# Patient Record
Sex: Female | Born: 1960 | Hispanic: No | Marital: Married | Smoking: Never smoker
Health system: Southern US, Community
[De-identification: ages and names within clinical notes are randomized; demographics above are authoritative.]

## PROBLEM LIST (undated history)

## (undated) DIAGNOSIS — R011 Cardiac murmur, unspecified: Secondary | ICD-10-CM

## (undated) DIAGNOSIS — D473 Essential (hemorrhagic) thrombocythemia: Secondary | ICD-10-CM

## (undated) DIAGNOSIS — E785 Hyperlipidemia, unspecified: Secondary | ICD-10-CM

## (undated) DIAGNOSIS — Z87440 Personal history of urinary (tract) infections: Secondary | ICD-10-CM

## (undated) DIAGNOSIS — Z0000 Encounter for general adult medical examination without abnormal findings: Principal | ICD-10-CM

## (undated) DIAGNOSIS — J329 Chronic sinusitis, unspecified: Secondary | ICD-10-CM

## (undated) DIAGNOSIS — L989 Disorder of the skin and subcutaneous tissue, unspecified: Secondary | ICD-10-CM

## (undated) DIAGNOSIS — I341 Nonrheumatic mitral (valve) prolapse: Secondary | ICD-10-CM

## (undated) DIAGNOSIS — Z124 Encounter for screening for malignant neoplasm of cervix: Secondary | ICD-10-CM

## (undated) HISTORY — DX: Personal history of urinary (tract) infections: Z87.440

## (undated) HISTORY — DX: Encounter for general adult medical examination without abnormal findings: Z00.00

## (undated) HISTORY — DX: Disorder of the skin and subcutaneous tissue, unspecified: L98.9

## (undated) HISTORY — DX: Nonrheumatic mitral (valve) prolapse: I34.1

## (undated) HISTORY — DX: Cardiac murmur, unspecified: R01.1

## (undated) HISTORY — DX: Chronic sinusitis, unspecified: J32.9

## (undated) HISTORY — DX: Essential (hemorrhagic) thrombocythemia: D47.3

## (undated) HISTORY — DX: Encounter for screening for malignant neoplasm of cervix: Z12.4

## (undated) HISTORY — DX: Hyperlipidemia, unspecified: E78.5

---
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
## 2004-03-17 ENCOUNTER — Ambulatory Visit: Admission: RE | Admit: 2004-03-17 | Discharge: 2004-05-09 | Payer: Self-pay | Admitting: Radiation Oncology

## 2004-06-08 ENCOUNTER — Ambulatory Visit: Admission: RE | Admit: 2004-06-08 | Discharge: 2004-06-08 | Payer: Self-pay | Admitting: Radiation Oncology

## 2004-07-20 ENCOUNTER — Ambulatory Visit: Admission: RE | Admit: 2004-07-20 | Discharge: 2004-07-20 | Payer: Self-pay | Admitting: Radiation Oncology

## 2010-12-29 ENCOUNTER — Ambulatory Visit (INDEPENDENT_AMBULATORY_CARE_PROVIDER_SITE_OTHER): Payer: BC Managed Care – PPO | Admitting: Family Medicine

## 2010-12-29 ENCOUNTER — Encounter: Payer: Self-pay | Admitting: Family Medicine

## 2010-12-29 ENCOUNTER — Other Ambulatory Visit: Payer: Self-pay | Admitting: Family Medicine

## 2010-12-29 DIAGNOSIS — R011 Cardiac murmur, unspecified: Secondary | ICD-10-CM | POA: Insufficient documentation

## 2010-12-29 DIAGNOSIS — Z87440 Personal history of urinary (tract) infections: Secondary | ICD-10-CM | POA: Insufficient documentation

## 2010-12-29 DIAGNOSIS — I341 Nonrheumatic mitral (valve) prolapse: Secondary | ICD-10-CM | POA: Insufficient documentation

## 2010-12-29 DIAGNOSIS — D473 Essential (hemorrhagic) thrombocythemia: Secondary | ICD-10-CM

## 2010-12-29 DIAGNOSIS — L989 Disorder of the skin and subcutaneous tissue, unspecified: Secondary | ICD-10-CM

## 2010-12-29 DIAGNOSIS — R109 Unspecified abdominal pain: Secondary | ICD-10-CM | POA: Insufficient documentation

## 2010-12-29 DIAGNOSIS — Z1211 Encounter for screening for malignant neoplasm of colon: Secondary | ICD-10-CM

## 2010-12-29 DIAGNOSIS — E785 Hyperlipidemia, unspecified: Secondary | ICD-10-CM

## 2010-12-29 HISTORY — DX: Personal history of urinary (tract) infections: Z87.440

## 2010-12-29 HISTORY — DX: Disorder of the skin and subcutaneous tissue, unspecified: L98.9

## 2010-12-29 LAB — POCT URINALYSIS DIPSTICK
Blood, UA: NEGATIVE
Glucose, UA: NEGATIVE
Ketones, UA: 15
Spec Grav, UA: 1.02
pH, UA: 7

## 2010-12-29 MED ORDER — ALIGN PO CAPS: 1.0000 | ORAL_CAPSULE | Freq: Every day | ORAL | Status: DC

## 2010-12-29 MED ORDER — FLUCONAZOLE 150 MG PO TABS: 150.0000 mg | ORAL_TABLET | Freq: Once | ORAL | Status: AC

## 2010-12-29 NOTE — Assessment & Plan Note (Signed)
Urine looks clear today and she is asymptomatic at this time. She is encouraged to maintain adequate hydration and increase cranberry. She will notify us of any returning symptoms for repeat urinalysis. She has been worked up by urology in the past and describes a cystoscopy which was unremarkable. She was maintained on Macrodantin for many years and now off of Macrodantin she has roughly one UTI every 6 months or so. We are going to give her one dose of Diflucan to the recent Bactrim use and one episode of abdominal pain

## 2010-12-29 NOTE — Patient Instructions (Signed)

## 2010-12-30 ENCOUNTER — Encounter: Payer: Self-pay | Admitting: Family Medicine

## 2010-12-30 DIAGNOSIS — D473 Essential (hemorrhagic) thrombocythemia: Secondary | ICD-10-CM | POA: Insufficient documentation

## 2010-12-30 DIAGNOSIS — D75839 Thrombocytosis, unspecified: Secondary | ICD-10-CM

## 2010-12-30 HISTORY — DX: Thrombocytosis, unspecified: D75.839

## 2010-12-30 LAB — BASIC METABOLIC PANEL
CO2: 26 mEq/L (ref 19–32)
Calcium: 9.3 mg/dL (ref 8.4–10.5)
Creat: 0.59 mg/dL (ref 0.50–1.10)
Sodium: 140 mEq/L (ref 135–145)

## 2010-12-30 LAB — CBC
Hemoglobin: 12.5 g/dL (ref 12.0–15.0)
MCH: 30.4 pg (ref 26.0–34.0)
MCHC: 32.3 g/dL (ref 30.0–36.0)
Platelets: 545 10*3/uL — ABNORMAL HIGH (ref 150–400)

## 2010-12-30 LAB — HEPATIC FUNCTION PANEL
Alkaline Phosphatase: 59 U/L (ref 39–117)
Bilirubin, Direct: 0.1 mg/dL (ref 0.0–0.3)
Indirect Bilirubin: 0.3 mg/dL (ref 0.0–0.9)
Total Bilirubin: 0.4 mg/dL (ref 0.3–1.2)

## 2010-12-30 LAB — SEDIMENTATION RATE: Sed Rate: 16 mm/hr (ref 0–22)

## 2010-12-30 NOTE — Assessment & Plan Note (Signed)
oid trans fats and maintain a hi fiber and lean protein diet. Consider a fish oil cap daily

## 2010-12-30 NOTE — Assessment & Plan Note (Signed)
Has had 2 episodes of abdominal cramping. One lasted 5 hours, she felt she had a low grade fever and was very tired. Denies any constipation/diarrhea/nausea/diaphoresis. She is given one dose of Diflucan to address some yeast overgrowth. She will start a probiotics and she is referred to GI for colonoscopyand further evaluation

## 2010-12-30 NOTE — Assessment & Plan Note (Signed)
Has only been present for a week. Her previous physician tried to aspirate it without success. She is referred to dermatology for excision and skin exam

## 2010-12-30 NOTE — Progress Notes (Signed)
Katherine Beasley [id number] 09/12/1960 12/30/2010      Progress Note New Patient  Subjective  Chief Complaint  Chief Complaint  Patient presents with  . Establish Care    recurrent bladder infections, recent bactrim RX; also had cyst on chest, MD unable to aspirate  . Abdominal Pain    associated with back pain, questions if this could be kidney stone, pain resolved at this time    HPI  Patient is a 50 year old Caucasian female who is in today to establish care her major medical history in the past has been recurrent UTIs. She has undergone cystoscopy. She reports this was unremarkable. She was maintained on Macrodantin but is no longer maintained on this. Has a UTI roughly every 6 months. Which is treated with Bactrim. No urinary symptoms at this time. She is noting she had couple of episodes of crampy abdominal pain last week that are concerning to her. No one around her has had similar symptoms. She normally has a formed brown bowel movement daily and that is still remained true. She did have one day with this 2 bowel movements instead of one. She describes diffuse abdominal cramping which lasted about 5 hours and then resolve. Then had another episode a few days later but localized to the left lower quadrant and has now resolved. She also had an episode of low back pain but that has resolved as well. She's never seen any bloody or tarry stool. She's not had any nausea, vomiting, diaphoresis. No anorexia. She feels well today. She does have some skin lesions as well. He had a pustular lesion on her sternum which resolved but then last week she developed a new lesion that was not pustular is firm persistent. Her previous PMD tried to aspirate it but it did not succeed. She is yet to go for colonoscopy. No chest pain, palpitations, SOB, fevers  Past Medical History  Diagnosis Date  . MVP (mitral valve prolapse) 30 years ago  . Abdominal pain 12/29/2010  . Heart murmur   . Hyperlipidemia    no meds, diet only  . History of recurrent UTIs 12/29/2010  . Skin lesion of chest wall 12/29/2010  . Thrombocytosis 12/30/2010    Past Surgical History  Procedure Date  . Cesarean section     x 2  . Tonsillectomy 1966    Family History  Problem Relation Age of Onset  . Hyperlipidemia Mother   . Thyroid disease Mother     partial thyroidectomy many years ago for goiter now hypothyroid  . Cancer Father 57    prostate  . Hyperlipidemia Father   . Hypertension Father   . Diabetes Paternal Aunt   . Diabetes Paternal Uncle   . Cancer Maternal Grandmother 63    breast  . Stroke Paternal Grandfather   . Diabetes Paternal Grandfather     History   Social History  . Marital Status: Married    Spouse Name: Genevie Cheshire    Number of Children: 2  . Years of Education: N/A   Occupational History  . Not on file.   Social History Main Topics  . Smoking status: Never Smoker   . Smokeless tobacco: Never Used  . Alcohol Use: No  . Drug Use: No  . Sexually Active: Yes   Other Topics Concern  . Not on file   Social History Narrative  . No narrative on file    No current outpatient prescriptions on file prior to visit.    No Known  Allergies  Review of Systems  Review of Systems  Constitutional: Negative for fever, chills and malaise/fatigue.  HENT: Negative for hearing loss, nosebleeds and congestion.   Eyes: Negative for discharge.  Respiratory: Negative for cough, sputum production, shortness of breath and wheezing.   Cardiovascular: Negative for chest pain, palpitations and leg swelling.  Gastrointestinal: Negative for heartburn, nausea, vomiting, abdominal pain, diarrhea, constipation and blood in stool.  Genitourinary: Negative for dysuria, urgency, frequency and hematuria.  Musculoskeletal: Negative for myalgias, back pain and falls.  Skin: Positive for rash.       2 cm lesion, raised, fixed, nontender no warmth or erythema on sternum, semifirm  Neurological:  Negative for dizziness, tremors, sensory change, focal weakness, loss of consciousness, weakness and headaches.  Endo/Heme/Allergies: Negative for polydipsia. Does not bruise/bleed easily.  Psychiatric/Behavioral: Negative for depression and suicidal ideas. The patient is not nervous/anxious and does not have insomnia.     Objective  BP 137/78  Pulse 68  Temp(Src) 97.9 F (36.6 C) (Oral)  Ht 5' 5.5" (1.664 m)  Wt 153 lb (69.4 kg)  BMI 25.07 kg/m2  SpO2 100%  Physical Exam  Physical Exam  Constitutional: She is oriented to person, place, and time and well-developed, well-nourished, and in no distress. No distress.  HENT:  Head: Normocephalic and atraumatic.  Right Ear: External ear normal.  Left Ear: External ear normal.  Nose: Nose normal.  Mouth/Throat: Oropharynx is clear and moist. No oropharyngeal exudate.  Eyes: Conjunctivae are normal. Pupils are equal, round, and reactive to light. Right eye exhibits no discharge. Left eye exhibits no discharge. No scleral icterus.  Neck: Normal range of motion. Neck supple. No thyromegaly present.  Cardiovascular: Normal rate, regular rhythm and intact distal pulses.   Murmur heard.      1/6 sys M  Pulmonary/Chest: Effort normal and breath sounds normal. No respiratory distress. She has no wheezes. She has no rales.  Abdominal: Soft. Bowel sounds are normal. She exhibits no distension and no mass. There is no tenderness.  Musculoskeletal: Normal range of motion. She exhibits no edema and no tenderness.  Lymphadenopathy:    She has no cervical adenopathy.  Neurological: She is alert and oriented to person, place, and time. She has normal reflexes. No cranial nerve deficit. Gait normal. Coordination normal.  Skin: Skin is warm and dry. She is not diaphoretic.       2 cm, raised lesion over sternum, no erythema, no warmth, no pustular component  Psychiatric: Mood, memory and affect normal.       Assessment & Plan  History of  recurrent UTIs Urine looks clear today and she is asymptomatic at this time. She is encouraged to maintain adequate hydration and increase cranberry. She will notify us of any returning symptoms for repeat urinalysis. She has been worked up by urology in the past and describes a cystoscopy which was unremarkable. She was maintained on Macrodantin for many years and now off of Macrodantin she has roughly one UTI every 6 months or so. We are going to give her one dose of Diflucan to the recent Bactrim use and one episode of abdominal pain  Abdominal pain Has had 2 episodes of abdominal cramping. One lasted 5 hours, she felt she had a low grade fever and was very tired. Denies any constipation/diarrhea/nausea/diaphoresis. She is given one dose of Diflucan to address some yeast overgrowth. She will start a probiotics and she is referred to GI for colonoscopyand further evaluation  Thrombocytosis No known history,  will repeat cbc this week after increased hydration.  Skin lesion of chest wall Has only been present for a week. Her previous physician tried to aspirate it without success. She is referred to dermatology for excision and skin exam  Hyperlipidemia oid trans fats and maintain a hi fiber and lean protein diet. Consider a fish oil cap daily

## 2010-12-30 NOTE — Assessment & Plan Note (Signed)
No known history, will repeat cbc this week after increased hydration.

## 2011-01-01 ENCOUNTER — Encounter: Payer: Self-pay | Admitting: Gastroenterology

## 2011-01-03 ENCOUNTER — Other Ambulatory Visit (INDEPENDENT_AMBULATORY_CARE_PROVIDER_SITE_OTHER): Payer: PRIVATE HEALTH INSURANCE

## 2011-01-03 DIAGNOSIS — R109 Unspecified abdominal pain: Secondary | ICD-10-CM

## 2011-01-03 LAB — CBC
HCT: 39 % (ref 36.0–46.0)
Hemoglobin: 13.1 g/dL (ref 12.0–15.0)
MCH: 30.8 pg (ref 26.0–34.0)
MCV: 91.5 fL (ref 78.0–100.0)
Platelets: 398 10*3/uL (ref 150–400)
RBC: 4.26 MIL/uL (ref 3.87–5.11)
WBC: 6.2 10*3/uL (ref 4.0–10.5)

## 2011-01-17 ENCOUNTER — Encounter: Payer: Self-pay | Admitting: Family Medicine

## 2011-01-19 ENCOUNTER — Encounter: Payer: Self-pay | Admitting: Gastroenterology

## 2011-01-19 ENCOUNTER — Other Ambulatory Visit: Payer: PRIVATE HEALTH INSURANCE

## 2011-01-19 ENCOUNTER — Ambulatory Visit (INDEPENDENT_AMBULATORY_CARE_PROVIDER_SITE_OTHER): Payer: BC Managed Care – PPO | Admitting: Gastroenterology

## 2011-01-19 VITALS — BP 132/76 | HR 60 | Ht 67.0 in | Wt 153.2 lb

## 2011-01-19 DIAGNOSIS — R109 Unspecified abdominal pain: Secondary | ICD-10-CM

## 2011-01-19 DIAGNOSIS — R1010 Upper abdominal pain, unspecified: Secondary | ICD-10-CM | POA: Insufficient documentation

## 2011-01-19 DIAGNOSIS — Z1211 Encounter for screening for malignant neoplasm of colon: Secondary | ICD-10-CM

## 2011-01-19 MED ORDER — PEG-KCL-NACL-NASULF-NA ASC-C 100 G PO SOLR: 1.0000 | Freq: Once | ORAL | Status: DC

## 2011-01-19 NOTE — Progress Notes (Signed)
History of Present Illness:  This is a extremely pleasant 50 year old Caucasian female referred through the courtesy of Drs. Reuel Derby for evaluation of intermittent upper abdominal discomfort over the last several weeks with low-grade fever. Labs show normal CBC, metabolic functions, and liver enzymes. Patient denies nausea vomiting, reflux symptoms, dysphagia, melena, rub or systemic or hepatobiliary complaints. She gives no known history of peptic ulcer disease, hepatitis or pancreatitis. There is no history of alcohol, cigarette, or NSAID abuse. Currently he is on probiotic therapy. Family history is remarkable for colon polyps in her mother at an elderly age. The patient does have mild lactose intolerance. She has not had previous endoscopic exams or radiographic imaging studies.  I have reviewed this patient's present history, medical and surgical past history, allergies and medications.     ROS: The remainder of the 10 point ROS is negative... patient has had chronic recurrent urinary tract infections but has had a negative recent urinalysis. She also questions possible kidney stone passage with recent low back pain. Now she has had a previous negative cystoscopy. Currently she denies any genitourinary or gynecologic symptoms. She has undergone menopause.    Physical Exam: General well developed well nourished patient in no acute distress, appearing her stated age Eyes PERRLA, no icterus, fundoscopic exam per opthamologist Skin no lesions noted Neck supple, no adenopathy, no thyroid enlargement, no tenderness Chest clear to percussion and auscultation Heart no significant murmurs, gallops or rubs noted Abdomen no hepatosplenomegaly masses or tenderness, BS normal. . Extremities no acute joint lesions, edema, phlebitis or evidence of cellulitis. Neurologic patient oriented x 3, cranial nerves intact, no focal neurologic deficits noted. Psychological mental status normal and normal  affect.  Assessment and plan: I have ordered upper abdominal ultrasound exam to exclude cholelithiasis, also any evidence of hydronephrosis and kidney stones. We will check celiac panel per her symptoms and [place] lineage. Also we have scheduled a screening colonoscopy because of her family history and her age. Patient currently denies any dyspepsia, reflux symptoms, or upper abdominal issues. He may need endoscopic exam depending on her lab work and clinical course. Otherwise she is to continue medications as listed and reviewed per primary care.  Encounter Diagnoses  Name Primary?  Marland Kitchen Upper abdominal pain Yes  . Screening for colon cancer

## 2011-01-19 NOTE — Patient Instructions (Signed)
You have been scheduled for an Abdominal Ultrasound at [facility] on Monday 01/23/11 at 11:00 am. Please arrive at the Radiology Dept on the 1st floor at 10:45 am. Please have nothing to eat or drink after 5:00 am which is 6 hours prior to the test. You have been scheduled for a Colonoscopy with separate instructions given. Your prep kit has been sent to your pharmacy for you to pick up. Please go to the basement upon leaving today to have your labs done.

## 2011-01-22 ENCOUNTER — Ambulatory Visit (HOSPITAL_COMMUNITY)
Admission: RE | Admit: 2011-01-22 | Discharge: 2011-01-22 | Disposition: A | Discharge status: Home / Self Care | Payer: BC Managed Care – PPO | Source: Ambulatory Visit | Attending: Gastroenterology | Admitting: Gastroenterology

## 2011-01-22 DIAGNOSIS — R109 Unspecified abdominal pain: Secondary | ICD-10-CM | POA: Insufficient documentation

## 2011-01-22 DIAGNOSIS — R1010 Upper abdominal pain, unspecified: Secondary | ICD-10-CM

## 2011-01-22 DIAGNOSIS — K824 Cholesterolosis of gallbladder: Secondary | ICD-10-CM | POA: Insufficient documentation

## 2011-01-22 LAB — GLIA (IGA/G) + TTG IGA
Gliadin IgA: 48.4 U/mL — ABNORMAL HIGH (ref <20)
Gliadin IgG: 6.7 U/mL (ref <20)
Tissue Transglutaminase Ab, IgA: 7.6 U/mL (ref <20)

## 2011-01-23 ENCOUNTER — Telehealth: Payer: Self-pay | Admitting: *Deleted

## 2011-01-23 NOTE — Telephone Encounter (Signed)
lmom for pt to call back if she has any questions. Her labs showed a possible insensitivity to Gluten and he would like for her to try a Gluten Free for 6 weeks; I am mailing a copy of the diet.

## 2011-01-23 NOTE — Telephone Encounter (Signed)
Spoke with pt to inform her Dr Jarold Motto would like for her to try a Gluten Free diet for 6 weeks to see if she feels better. Her lab indicated she may have  Sensitivity to gluten; mailed her a copy of the diet. Pt will call for problems.

## 2011-02-05 ENCOUNTER — Ambulatory Visit (AMBULATORY_SURGERY_CENTER): Payer: BC Managed Care – PPO | Admitting: Gastroenterology

## 2011-02-05 ENCOUNTER — Encounter: Payer: Self-pay | Admitting: Gastroenterology

## 2011-02-05 DIAGNOSIS — K644 Residual hemorrhoidal skin tags: Secondary | ICD-10-CM

## 2011-02-05 DIAGNOSIS — Z8371 Family history of colonic polyps: Secondary | ICD-10-CM

## 2011-02-05 DIAGNOSIS — K9041 Non-celiac gluten sensitivity: Secondary | ICD-10-CM

## 2011-02-05 DIAGNOSIS — Z1211 Encounter for screening for malignant neoplasm of colon: Secondary | ICD-10-CM | POA: Insufficient documentation

## 2011-02-05 DIAGNOSIS — R109 Unspecified abdominal pain: Secondary | ICD-10-CM | POA: Insufficient documentation

## 2011-02-05 DIAGNOSIS — Z83719 Family history of colon polyps, unspecified: Secondary | ICD-10-CM

## 2011-02-05 MED ORDER — SODIUM CHLORIDE 0.9 % IV SOLN: 500.0000 mL | INTRAVENOUS | Status: DC

## 2011-02-05 NOTE — Patient Instructions (Signed)
INTERNAL HEMORRHOIDS  SEE GREEN AND BLUE SHEETS FOR ADDITIONAL D/C INSTRUCTIONS.

## 2011-02-05 NOTE — Progress Notes (Signed)
Patient did not experience any of the following events: a burn prior to discharge; a fall within the facility; wrong site/side/patient/procedure/implant event; or a hospital transfer or hospital admission upon discharge from the facility. (G8907) Patient did not have preoperative order for IV antibiotic SSI prophylaxis. (G8918)  

## 2011-02-06 ENCOUNTER — Telehealth: Payer: Self-pay | Admitting: *Deleted

## 2011-02-06 NOTE — Telephone Encounter (Signed)

## 2011-04-04 ENCOUNTER — Other Ambulatory Visit (HOSPITAL_COMMUNITY)
Admission: RE | Admit: 2011-04-04 | Discharge: 2011-04-04 | Disposition: A | Discharge status: Home / Self Care | Payer: BC Managed Care – PPO | Source: Ambulatory Visit | Attending: Family Medicine | Admitting: Family Medicine

## 2011-04-04 ENCOUNTER — Encounter: Payer: Self-pay | Admitting: Family Medicine

## 2011-04-04 ENCOUNTER — Ambulatory Visit (INDEPENDENT_AMBULATORY_CARE_PROVIDER_SITE_OTHER): Payer: BC Managed Care – PPO | Admitting: Family Medicine

## 2011-04-04 VITALS — BP 122/80 | HR 81 | Temp 98.5°F | Ht 67.0 in | Wt 152.8 lb

## 2011-04-04 DIAGNOSIS — Z87440 Personal history of urinary (tract) infections: Secondary | ICD-10-CM

## 2011-04-04 DIAGNOSIS — Z124 Encounter for screening for malignant neoplasm of cervix: Secondary | ICD-10-CM

## 2011-04-04 DIAGNOSIS — Z01419 Encounter for gynecological examination (general) (routine) without abnormal findings: Secondary | ICD-10-CM | POA: Insufficient documentation

## 2011-04-04 DIAGNOSIS — K9041 Non-celiac gluten sensitivity: Secondary | ICD-10-CM

## 2011-04-04 DIAGNOSIS — E785 Hyperlipidemia, unspecified: Secondary | ICD-10-CM

## 2011-04-04 DIAGNOSIS — Z23 Encounter for immunization: Secondary | ICD-10-CM

## 2011-04-04 DIAGNOSIS — K900 Celiac disease: Secondary | ICD-10-CM

## 2011-04-04 DIAGNOSIS — Z1211 Encounter for screening for malignant neoplasm of colon: Secondary | ICD-10-CM

## 2011-04-04 HISTORY — DX: Encounter for screening for malignant neoplasm of cervix: Z12.4

## 2011-04-04 NOTE — Assessment & Plan Note (Signed)
Pap taken today, results pending. No concerns identified on exam. Repeat pap in 2-3 years time at patient's discretion if results today are normal. Patient without c/o.

## 2011-04-04 NOTE — Assessment & Plan Note (Addendum)
Pap smear taken today no concerns  Tdap given today

## 2011-04-04 NOTE — Assessment & Plan Note (Signed)
Patient has redoubled her efforts to avoid gluten sincer her testing confirmed the senistivity and she does feel better.

## 2011-04-04 NOTE — Assessment & Plan Note (Signed)
Patient tolerated well.

## 2011-04-04 NOTE — Assessment & Plan Note (Signed)
Patient reports this is historically mild and she has it checked free at work annually, agrees to bring a copy of this years labs with hre to her next visit.

## 2011-04-04 NOTE — Patient Instructions (Signed)
Celiac Disease/Gluten Insensitivity Celiac disease is a digestive disease that causes your body's natural defense system (immune system) to react against its own cells. It interferes with taking in (absorbing) nutrients from food. Celiac disease is also known as celiac sprue, nontropical sprue, and gluten-sensitive enteropathy. People who have celiac disease cannot tolerate gluten. Gluten is a protein found in wheat, rye, and barley. With time, celiac disease will damage the cells lining the small intestine. This leads to being unable to absorb nutrients from food (malabsorption), diarrhea, and nutritional problems. CAUSES  Celiac disease is genetic. This means you have a higher likelihood of getting the disease if someone in your family has or has had it. Up to 10% of your close relatives (parent, sibling, child) may also have the disease.  People with celiac disease tend to have other autoimmune diseases. The link may be genetic. These diseases include:  Dermatitis herpetiformis.   Thyroid disease.   Systemic lupus erythematosis.   Type 1 diabetes.   Liver disease.   Collagen vascular disease.   Rheumatoid arthritis.   Sjgren's syndrome.  SYMPTOMS  The symptoms of celiac disease vary from person to person. The symptoms are generally digestive or nutritional. Digestive symptoms include:  Recurring belly (abdominal) bloating and pain.   Gas.   Long-term (chronic) diarrhea.   Pale, bad-smelling, greasy, or oily stool.  Nutritional symptoms include:  Failure to thrive in infants.   Delayed growth in children.   Weight loss in children and adults.   Missed menstrual periods (often due to extreme weight loss).   Anemia.   Weakening bones (osteoporosis).   Fatigue and weakness.   Tingling or other signs of nerve damage (peripheral neuropathy).   Depression.  DIAGNOSIS  If your symptoms and physical exam suggest that a digestive disorder or malnutrition is present, your  caregiver may suspect celiac disease. You may have already begun a gluten-free diet. If symptoms persist, testing may be needed to confirm the diagnosis. Some tests are best done while you are on a normal, unrestricted diet. Tests may include:  Blood tests to check for nutritional deficiencies.   Blood tests to look for evidence that the body is producing antibodies against its own small intestine cells.   Taking a tissue sample (biopsy) from the small bowel for evaluation.   X-rays of the small bowel.   Evaluating the stool for fat.   Tests to check for nutrient absorption from the intestine.  TREATMENT  It is important to seek treatment. Untreated celiac disease can cause growth problems (in children), anemia, osteoporosis, and possible nerve problems. A pregnant patient with untreated celiac disease has a higher risk of miscarriage, and the fetus has an increased risk of low birth weight and other growth problems. If celiac disease is diagnosed in the early stages, treatment can allow you to live a long, nearly symptom-free life. Treatment includes following a gluten-free diet. This means avoiding all foods that contain gluten. Eating even a small amount of gluten can damage your intestine. For most people, following this diet will stop symptoms. It will heal existing intestinal damage and prevent further damage. Improvements begin within days of starting the diet. The small intestine is usually completely healed within 3 to 6 months, or it may take up to 2 years for older adults. A small percentage of people do not improve on the gluten-free diet. Depending on your age and the stage at which you were diagnosed, some problems such as delayed growth and discolored teeth  may not improve. Sometimes, damaged intestines cannot heal. If your intestines are not absorbing enough nutrients, you may need to receive nutrition supplements through an intravenous (IV) tube. Drug treatments are being tested for  unresponsive celiac disease. In this case, you may need to be evaluated for complications of the disease. Your caregiver may also recommend:  A pneumonia vaccination.   Nutrients and other treatments for any nutritional deficiencies.  Your caregiver can provide you with more information on a gluten-free diet. Discussion with a dietician skilled in this illness will be valuable. Support groups may also be helpful. HOME CARE INSTRUCTIONS   Focus on a gluten-free diet. This diet must become a way of life.   Monitor your response to the gluten-free diet and treat any nutritional deficiencies.   Prepare ahead of time if you decide to eat outside the home.   Make and keep your regular follow-up visits with your caregiver.   Suggest to family members that they get screened for early signs of the disease.  SEEK MEDICAL CARE IF:   You continue to have digestive symptoms (gas, cramping, diarrhea) despite a proper diet.   You have trouble sticking to the gluten-free diet.   You develop an itchy rash with groups of tiny blisters.   You develop severe weakness, balance problems, menstrual problems, or depression.  Document Released: 02/19/2005 Document Revised: 11/01/2010 Document Reviewed: 06/08/2009 [facility] Patient Information 2012 [place], [place].

## 2011-04-04 NOTE — Assessment & Plan Note (Signed)
No recent episodes. Maintain adequate hydration.

## 2011-04-04 NOTE — Progress Notes (Signed)
Patient ID: Katherine Beasley, female   DOB: 09-06-1960, 51 y.o.   MRN: [id number] Katherine Beasley [id number] [birth year] 04/04/2011      Progress Note-Follow Up  Subjective  Chief Complaint  Chief Complaint  Patient presents with  . Follow-up    3 month follow up  . Gynecologic Exam    pap    HPI  51 year old Caucasian female who is in today for followup and Pap smear. She reports she is doing much better at this time. She felt well since her last visit. She proceeded with colonoscopy and lab work with GI and did have some gluten insensitivity confirmed via lab work. She's been avoiding gluten and actually feels much better. Denies abdominal pain or any bowel difficulties. She's had no recent illness, fevers, chills, chest pain, palpitations, shortness of breath, GI or GU complaints. She offers no GYN or breast complaints. She's had no recent urinary tract infection and overall she says she feels very well at this time.  Past Medical History  Diagnosis Date  . MVP (mitral valve prolapse) 30 years ago  . Abdominal pain 12/29/2010  . Heart murmur   . Hyperlipidemia     no meds, diet only  . History of recurrent UTIs 12/29/2010  . Skin lesion of chest wall 12/29/2010  . Thrombocytosis 12/30/2010  . Cervical cancer screening 04/04/2011    Past Surgical History  Procedure Date  . Cesarean section     x 2  . Tonsillectomy 1966    Family History  Problem Relation Age of Onset  . Hyperlipidemia Mother   . Thyroid disease Mother     partial thyroidectomy many years ago for goiter now hypothyroid  . Colon polyps Mother   . Cancer Father 79    prostate  . Hyperlipidemia Father   . Hypertension Father   . Prostate cancer Father   . Diabetes Paternal Aunt   . Diabetes Paternal Uncle   . Cancer Maternal Grandmother 98    breast  . Breast cancer Maternal Grandmother   . Stroke Paternal Grandfather   . Diabetes Paternal Grandfather     History   Social History  . Marital  Status: Married    Spouse Name: Genevie Cheshire    Number of Children: 2  . Years of Education: N/A   Occupational History  . Not on file.   Social History Main Topics  . Smoking status: Never Smoker   . Smokeless tobacco: Never Used  . Alcohol Use: No  . Drug Use: No  . Sexually Active: Yes   Other Topics Concern  . Not on file   Social History Narrative  . No narrative on file    Current Outpatient Prescriptions on File Prior to Visit  Medication Sig Dispense Refill  . calcium carbonate (OS-CAL) 600 MG TABS Take 2 tablets by mouth daily.        . Omega-3 Fatty Acids (FISH OIL) 1200 MG CAPS Take 1 capsule by mouth 3 (three) times daily after meals.          No Known Allergies  Review of Systems  Review of Systems  Constitutional: Negative for fever and malaise/fatigue.  HENT: Negative for congestion.   Eyes: Negative for discharge.  Respiratory: Negative for shortness of breath.   Cardiovascular: Negative for chest pain, palpitations and leg swelling.  Gastrointestinal: Negative for nausea, abdominal pain and diarrhea.  Genitourinary: Negative for dysuria.  Musculoskeletal: Negative for falls.  Skin: Negative for rash.  Neurological: Negative for  loss of consciousness and headaches.  Endo/Heme/Allergies: Negative for polydipsia.  Psychiatric/Behavioral: Negative for depression and suicidal ideas. The patient is not nervous/anxious and does not have insomnia.     Objective  BP 122/80  Pulse 81  Temp(Src) 98.5 F (36.9 C) (Temporal)  Ht 5\' 7"  (1.702 m)  Wt 152 lb 12.8 oz (69.31 kg)  BMI 23.93 kg/m2  SpO2 100%  Physical Exam  Physical Exam  Constitutional: She is oriented to person, place, and time and well-developed, well-nourished, and in no distress. No distress.  HENT:  Head: Normocephalic and atraumatic.  Eyes: Conjunctivae are normal.  Neck: Neck supple. No thyromegaly present.  Cardiovascular: Normal rate, regular rhythm and normal heart sounds.   No  murmur heard. Pulmonary/Chest: Effort normal and breath sounds normal. She has no wheezes.  Abdominal: She exhibits no distension and no mass.  Genitourinary: Vagina normal, uterus normal, cervix normal, right adnexa normal and left adnexa normal. No vaginal discharge found.  Musculoskeletal: She exhibits no edema.  Lymphadenopathy:    She has no cervical adenopathy.  Neurological: She is alert and oriented to person, place, and time.  Skin: Skin is warm and dry. No rash noted. She is not diaphoretic.  Psychiatric: Memory, affect and judgment normal.    No results found for this basename: TSH   Lab Results  Component Value Date   WBC 6.2 01/03/2011   HGB 13.1 01/03/2011   HCT 39.0 01/03/2011   MCV 91.5 01/03/2011   PLT 398 01/03/2011   Lab Results  Component Value Date   CREATININE 0.59 12/29/2010   BUN 12 12/29/2010   NA 140 12/29/2010   K 4.0 12/29/2010   CL 103 12/29/2010   CO2 26 12/29/2010   Lab Results  Component Value Date   ALT 14 12/29/2010   AST 14 12/29/2010   ALKPHOS 59 12/29/2010   BILITOT 0.4 12/29/2010   No results found for this basename: CHOL   No results found for this basename: HDL   No results found for this basename: LDLCALC   No results found for this basename: TRIG   No results found for this basename: CHOLHDL     Assessment & Plan  Cervical cancer screening Pap smear taken today no concerns  Tdap given today  Screening for colon cancer Pap taken today, results pending. No concerns identified on exam. Repeat pap in 2-3 years time at patient's discretion if results today are normal. Patient without c/o.  Special screening for malignant neoplasms, colon Patient tolerated well   Non-celiac gluten sensitivity Patient has redoubled her efforts to avoid gluten sincer her testing confirmed the senistivity and she does feel better.   History of recurrent UTIs No recent episodes. Maintain adequate hydration.  Hyperlipidemia Patient  reports this is historically mild and she has it checked free at work annually, agrees to bring a copy of this years labs with hre to her next visit.

## 2011-12-04 LAB — HM MAMMOGRAPHY

## 2012-03-13 ENCOUNTER — Telehealth: Payer: Self-pay | Admitting: Family Medicine

## 2012-03-13 MED ORDER — SCOPOLAMINE 1 MG/3DAYS TD PT72: 1.0000 | MEDICATED_PATCH | TRANSDERMAL | Status: DC

## 2012-03-13 NOTE — Telephone Encounter (Signed)
Please advise 

## 2012-03-13 NOTE — Telephone Encounter (Signed)
Pt informed. RX sent to pharmacy. 

## 2012-03-13 NOTE — Telephone Encounter (Signed)
Scopalamine patch, 1.5mg /72 hours, place behind ear change every 72 hours, disp #4 patches

## 2012-04-04 ENCOUNTER — Encounter: Payer: BC Managed Care – PPO | Admitting: Family Medicine

## 2012-04-11 ENCOUNTER — Ambulatory Visit (INDEPENDENT_AMBULATORY_CARE_PROVIDER_SITE_OTHER): Payer: 59 | Admitting: Family Medicine

## 2012-04-11 ENCOUNTER — Encounter: Payer: Self-pay | Admitting: Family Medicine

## 2012-04-11 VITALS — BP 144/82 | HR 81 | Temp 98.4°F | Ht 67.0 in | Wt 156.0 lb

## 2012-04-11 DIAGNOSIS — J069 Acute upper respiratory infection, unspecified: Secondary | ICD-10-CM

## 2012-04-11 DIAGNOSIS — J329 Chronic sinusitis, unspecified: Secondary | ICD-10-CM

## 2012-04-11 DIAGNOSIS — R109 Unspecified abdominal pain: Secondary | ICD-10-CM

## 2012-04-11 DIAGNOSIS — E785 Hyperlipidemia, unspecified: Secondary | ICD-10-CM

## 2012-04-11 DIAGNOSIS — Z0000 Encounter for general adult medical examination without abnormal findings: Secondary | ICD-10-CM

## 2012-04-11 LAB — LIPID PANEL
Cholesterol: 231 mg/dL — ABNORMAL HIGH (ref 0–200)
Total CHOL/HDL Ratio: 6
Triglycerides: 195 mg/dL — ABNORMAL HIGH (ref 0.0–149.0)
VLDL: 39 mg/dL (ref 0.0–40.0)

## 2012-04-11 LAB — RENAL FUNCTION PANEL
Albumin: 4.3 g/dL (ref 3.5–5.2)
BUN: 14 mg/dL (ref 6–23)
Calcium: 9.1 mg/dL (ref 8.4–10.5)
Glucose, Bld: 105 mg/dL — ABNORMAL HIGH (ref 70–99)
Potassium: 4.1 mEq/L (ref 3.5–5.1)

## 2012-04-11 LAB — HEPATIC FUNCTION PANEL
ALT: 20 U/L (ref 0–35)
Alkaline Phosphatase: 62 U/L (ref 39–117)
Bilirubin, Direct: 0 mg/dL (ref 0.0–0.3)
Total Bilirubin: 1 mg/dL (ref 0.3–1.2)

## 2012-04-11 LAB — CBC
MCV: 91.4 fl (ref 78.0–100.0)
RBC: 4.49 Mil/uL (ref 3.87–5.11)
WBC: 8.6 10*3/uL (ref 4.5–10.5)

## 2012-04-11 MED ORDER — AZITHROMYCIN 250 MG PO TABS: ORAL_TABLET | ORAL | Status: DC

## 2012-04-11 NOTE — Patient Instructions (Addendum)
Preventive Care for Adults, Female A healthy lifestyle and preventive care can promote health and wellness. Preventive health guidelines for women include the following key practices.  A routine yearly physical is a good way to check with your caregiver about your health and preventive screening. It is a chance to share any concerns and updates on your health, and to receive a thorough exam.  Visit your dentist for a routine exam and preventive care every 6 months. Brush your teeth twice a day and floss once a day. Good oral hygiene prevents tooth decay and gum disease.  The frequency of eye exams is based on your age, health, family medical history, use of contact lenses, and other factors. Follow your caregiver's recommendations for frequency of eye exams.  Eat a healthy diet. Foods like vegetables, fruits, whole grains, low-fat dairy products, and lean protein foods contain the nutrients you need without too many calories. Decrease your intake of foods high in solid fats, added sugars, and salt. Eat the right amount of calories for you.Get information about a proper diet from your caregiver, if necessary.  Regular physical exercise is one of the most important things you can do for your health. Most adults should get at least 150 minutes of moderate-intensity exercise (any activity that increases your heart rate and causes you to sweat) each week. In addition, most adults need muscle-strengthening exercises on 2 or more days a week.  Maintain a healthy weight. The body mass index (BMI) is a screening tool to identify possible weight problems. It provides an estimate of body fat based on height and weight. Your caregiver can help determine your BMI, and can help you achieve or maintain a healthy weight.For adults 20 years and older:  A BMI below 18.5 is considered underweight.  A BMI of 18.5 to 24.9 is normal.  A BMI of 25 to 29.9 is considered overweight.  A BMI of 30 and above is  considered obese.  Maintain normal blood lipids and cholesterol levels by exercising and minimizing your intake of saturated fat. Eat a balanced diet with plenty of fruit and vegetables. Blood tests for lipids and cholesterol should begin at age 20 and be repeated every 5 years. If your lipid or cholesterol levels are high, you are over 50, or you are at high risk for heart disease, you may need your cholesterol levels checked more frequently.Ongoing high lipid and cholesterol levels should be treated with medicines if diet and exercise are not effective.  If you smoke, find out from your caregiver how to quit. If you do not use tobacco, do not start.  If you are pregnant, do not drink alcohol. If you are breastfeeding, be very cautious about drinking alcohol. If you are not pregnant and choose to drink alcohol, do not exceed 1 drink per day. One drink is considered to be 12 ounces (355 mL) of beer, 5 ounces (148 mL) of wine, or 1.5 ounces (44 mL) of liquor.  Avoid use of street drugs. Do not share needles with anyone. Ask for help if you need support or instructions about stopping the use of drugs.  High blood pressure causes heart disease and increases the risk of stroke. Your blood pressure should be checked at least every 1 to 2 years. Ongoing high blood pressure should be treated with medicines if weight loss and exercise are not effective.  If you are 55 to 54 years old, ask your caregiver if you should take aspirin to prevent strokes.  Diabetes   screening involves taking a blood sample to check your fasting blood sugar level. This should be done once every 3 years, after age 45, if you are within normal weight and without risk factors for diabetes. Testing should be considered at a younger age or be carried out more frequently if you are overweight and have at least 1 risk factor for diabetes.  Breast cancer screening is essential preventive care for women. You should practice "breast  self-awareness." This means understanding the normal appearance and feel of your breasts and may include breast self-examination. Any changes detected, no matter how small, should be reported to a caregiver. Women in their 20s and 30s should have a clinical breast exam (CBE) by a caregiver as part of a regular health exam every 1 to 3 years. After age 40, women should have a CBE every year. Starting at age 40, women should consider having a mammography (breast X-ray test) every year. Women who have a family history of breast cancer should talk to their caregiver about genetic screening. Women at a high risk of breast cancer should talk to their caregivers about having magnetic resonance imaging (MRI) and a mammography every year.  The Pap test is a screening test for cervical cancer. A Pap test can show cell changes on the cervix that might become cervical cancer if left untreated. A Pap test is a procedure in which cells are obtained and examined from the lower end of the uterus (cervix).  Women should have a Pap test starting at age 21.  Between ages 21 and 29, Pap tests should be repeated every 2 years.  Beginning at age 30, you should have a Pap test every 3 years as long as the past 3 Pap tests have been normal.  Some women have medical problems that increase the chance of getting cervical cancer. Talk to your caregiver about these problems. It is especially important to talk to your caregiver if a new problem develops soon after your last Pap test. In these cases, your caregiver may recommend more frequent screening and Pap tests.  The above recommendations are the same for women who have or have not gotten the vaccine for human papillomavirus (HPV).  If you had a hysterectomy for a problem that was not cancer or a condition that could lead to cancer, then you no longer need Pap tests. Even if you no longer need a Pap test, a regular exam is a good idea to make sure no other problems are  starting.  If you are between ages 65 and 70, and you have had normal Pap tests going back 10 years, you no longer need Pap tests. Even if you no longer need a Pap test, a regular exam is a good idea to make sure no other problems are starting.  If you have had past treatment for cervical cancer or a condition that could lead to cancer, you need Pap tests and screening for cancer for at least 20 years after your treatment.  If Pap tests have been discontinued, risk factors (such as a new sexual partner) need to be reassessed to determine if screening should be resumed.  The HPV test is an additional test that may be used for cervical cancer screening. The HPV test looks for the virus that can cause the cell changes on the cervix. The cells collected during the Pap test can be tested for HPV. The HPV test could be used to screen women aged 30 years and older, and should   be used in women of any age who have unclear Pap test results. After the age of 30, women should have HPV testing at the same frequency as a Pap test.  Colorectal cancer can be detected and often prevented. Most routine colorectal cancer screening begins at the age of 50 and continues through age 75. However, your caregiver may recommend screening at an earlier age if you have risk factors for colon cancer. On a yearly basis, your caregiver may provide home test kits to check for hidden blood in the stool. Use of a small camera at the end of a tube, to directly examine the colon (sigmoidoscopy or colonoscopy), can detect the earliest forms of colorectal cancer. Talk to your caregiver about this at age 50, when routine screening begins. Direct examination of the colon should be repeated every 5 to 10 years through age 75, unless early forms of pre-cancerous polyps or small growths are found.  Hepatitis C blood testing is recommended for all people born from 1945 through 1965 and any individual with known risks for hepatitis C.  Practice  safe sex. Use condoms and avoid high-risk sexual practices to reduce the spread of sexually transmitted infections (STIs). STIs include gonorrhea, chlamydia, syphilis, trichomonas, herpes, HPV, and human immunodeficiency virus (HIV). Herpes, HIV, and HPV are viral illnesses that have no cure. They can result in disability, cancer, and death. Sexually active women aged 25 and younger should be checked for chlamydia. Older women with new or multiple partners should also be tested for chlamydia. Testing for other STIs is recommended if you are sexually active and at increased risk.  Osteoporosis is a disease in which the bones lose minerals and strength with aging. This can result in serious bone fractures. The risk of osteoporosis can be identified using a bone density scan. Women ages 65 and over and women at risk for fractures or osteoporosis should discuss screening with their caregivers. Ask your caregiver whether you should take a calcium supplement or vitamin D to reduce the rate of osteoporosis.  Menopause can be associated with physical symptoms and risks. Hormone replacement therapy is available to decrease symptoms and risks. You should talk to your caregiver about whether hormone replacement therapy is right for you.  Use sunscreen with sun protection factor (SPF) of 30 or more. Apply sunscreen liberally and repeatedly throughout the day. You should seek shade when your shadow is shorter than you. Protect yourself by wearing long sleeves, pants, a wide-brimmed hat, and sunglasses year round, whenever you are outdoors.  Once a month, do a whole body skin exam, using a mirror to look at the skin on your back. Notify your caregiver of new moles, moles that have irregular borders, moles that are larger than a pencil eraser, or moles that have changed in shape or color.  Stay current with required immunizations.  Influenza. You need a dose every fall (or winter). The composition of the flu vaccine  changes each year, so being vaccinated once is not enough.  Pneumococcal polysaccharide. You need 1 to 2 doses if you smoke cigarettes or if you have certain chronic medical conditions. You need 1 dose at age 65 (or older) if you have never been vaccinated.  Tetanus, diphtheria, pertussis (Tdap, Td). Get 1 dose of Tdap vaccine if you are younger than age 65, are over 65 and have contact with an infant, are a healthcare worker, are pregnant, or simply want to be protected from whooping cough. After that, you need a Td   booster dose every 10 years. Consult your caregiver if you have not had at least 3 tetanus and diphtheria-containing shots sometime in your life or have a deep or dirty wound.  HPV. You need this vaccine if you are a woman age 26 or younger. The vaccine is given in 3 doses over 6 months.  Measles, mumps, rubella (MMR). You need at least 1 dose of MMR if you were born in 1957 or later. You may also need a second dose.  Meningococcal. If you are age 19 to 21 and a first-year college student living in a residence hall, or have one of several medical conditions, you need to get vaccinated against meningococcal disease. You may also need additional booster doses.  Zoster (shingles). If you are age 60 or older, you should get this vaccine.  Varicella (chickenpox). If you have never had chickenpox or you were vaccinated but received only 1 dose, talk to your caregiver to find out if you need this vaccine.  Hepatitis A. You need this vaccine if you have a specific risk factor for hepatitis A virus infection or you simply wish to be protected from this disease. The vaccine is usually given as 2 doses, 6 to 18 months apart.  Hepatitis B. You need this vaccine if you have a specific risk factor for hepatitis B virus infection or you simply wish to be protected from this disease. The vaccine is given in 3 doses, usually over 6 months. Preventive Services / Frequency Ages 19 to 39  Blood  pressure check.** / Every 1 to 2 years.  Lipid and cholesterol check.** / Every 5 years beginning at age 20.  Clinical breast exam.** / Every 3 years for women in their 20s and 30s.  Pap test.** / Every 2 years from ages 21 through 29. Every 3 years starting at age 30 through age 65 or 70 with a history of 3 consecutive normal Pap tests.  HPV screening.** / Every 3 years from ages 30 through ages 65 to 70 with a history of 3 consecutive normal Pap tests.  Hepatitis C blood test.** / For any individual with known risks for hepatitis C.  Skin self-exam. / Monthly.  Influenza immunization.** / Every year.  Pneumococcal polysaccharide immunization.** / 1 to 2 doses if you smoke cigarettes or if you have certain chronic medical conditions.  Tetanus, diphtheria, pertussis (Tdap, Td) immunization. / A one-time dose of Tdap vaccine. After that, you need a Td booster dose every 10 years.  HPV immunization. / 3 doses over 6 months, if you are 26 and younger.  Measles, mumps, rubella (MMR) immunization. / You need at least 1 dose of MMR if you were born in 1957 or later. You may also need a second dose.  Meningococcal immunization. / 1 dose if you are age 19 to 21 and a first-year college student living in a residence hall, or have one of several medical conditions, you need to get vaccinated against meningococcal disease. You may also need additional booster doses.  Varicella immunization.** / Consult your caregiver.  Hepatitis A immunization.** / Consult your caregiver. 2 doses, 6 to 18 months apart.  Hepatitis B immunization.** / Consult your caregiver. 3 doses usually over 6 months. Ages 40 to 64  Blood pressure check.** / Every 1 to 2 years.  Lipid and cholesterol check.** / Every 5 years beginning at age 20.  Clinical breast exam.** / Every year after age 40.  Mammogram.** / Every year beginning at age 40   and continuing for as long as you are in good health. Consult with your  caregiver.  Pap test.** / Every 3 years starting at age 30 through age 65 or 70 with a history of 3 consecutive normal Pap tests.  HPV screening.** / Every 3 years from ages 30 through ages 65 to 70 with a history of 3 consecutive normal Pap tests.  Fecal occult blood test (FOBT) of stool. / Every year beginning at age 50 and continuing until age 75. You may not need to do this test if you get a colonoscopy every 10 years.  Flexible sigmoidoscopy or colonoscopy.** / Every 5 years for a flexible sigmoidoscopy or every 10 years for a colonoscopy beginning at age 50 and continuing until age 75.  Hepatitis C blood test.** / For all people born from 1945 through 1965 and any individual with known risks for hepatitis C.  Skin self-exam. / Monthly.  Influenza immunization.** / Every year.  Pneumococcal polysaccharide immunization.** / 1 to 2 doses if you smoke cigarettes or if you have certain chronic medical conditions.  Tetanus, diphtheria, pertussis (Tdap, Td) immunization.** / A one-time dose of Tdap vaccine. After that, you need a Td booster dose every 10 years.  Measles, mumps, rubella (MMR) immunization. / You need at least 1 dose of MMR if you were born in 1957 or later. You may also need a second dose.  Varicella immunization.** / Consult your caregiver.  Meningococcal immunization.** / Consult your caregiver.  Hepatitis A immunization.** / Consult your caregiver. 2 doses, 6 to 18 months apart.  Hepatitis B immunization.** / Consult your caregiver. 3 doses, usually over 6 months. Ages 65 and over  Blood pressure check.** / Every 1 to 2 years.  Lipid and cholesterol check.** / Every 5 years beginning at age 20.  Clinical breast exam.** / Every year after age 40.  Mammogram.** / Every year beginning at age 40 and continuing for as long as you are in good health. Consult with your caregiver.  Pap test.** / Every 3 years starting at age 30 through age 65 or 70 with a 3  consecutive normal Pap tests. Testing can be stopped between 65 and 70 with 3 consecutive normal Pap tests and no abnormal Pap or HPV tests in the past 10 years.  HPV screening.** / Every 3 years from ages 30 through ages 65 or 70 with a history of 3 consecutive normal Pap tests. Testing can be stopped between 65 and 70 with 3 consecutive normal Pap tests and no abnormal Pap or HPV tests in the past 10 years.  Fecal occult blood test (FOBT) of stool. / Every year beginning at age 50 and continuing until age 75. You may not need to do this test if you get a colonoscopy every 10 years.  Flexible sigmoidoscopy or colonoscopy.** / Every 5 years for a flexible sigmoidoscopy or every 10 years for a colonoscopy beginning at age 50 and continuing until age 75.  Hepatitis C blood test.** / For all people born from 1945 through 1965 and any individual with known risks for hepatitis C.  Osteoporosis screening.** / A one-time screening for women ages 65 and over and women at risk for fractures or osteoporosis.  Skin self-exam. / Monthly.  Influenza immunization.** / Every year.  Pneumococcal polysaccharide immunization.** / 1 dose at age 65 (or older) if you have never been vaccinated.  Tetanus, diphtheria, pertussis (Tdap, Td) immunization. / A one-time dose of Tdap vaccine if you are over   65 and have contact with an infant, are a healthcare worker, or simply want to be protected from whooping cough. After that, you need a Td booster dose every 10 years.  Varicella immunization.** / Consult your caregiver.  Meningococcal immunization.** / Consult your caregiver.  Hepatitis A immunization.** / Consult your caregiver. 2 doses, 6 to 18 months apart.  Hepatitis B immunization.** / Check with your caregiver. 3 doses, usually over 6 months. ** Family history and personal history of risk and conditions may change your caregiver's recommendations. Document Released: 04/17/2001 Document Revised: 05/14/2011  Document Reviewed: 07/17/2010 [employer] Patient Information 2013 [employer].  

## 2012-04-13 ENCOUNTER — Encounter: Payer: Self-pay | Admitting: Family Medicine

## 2012-04-13 DIAGNOSIS — Z0000 Encounter for general adult medical examination without abnormal findings: Secondary | ICD-10-CM

## 2012-04-13 DIAGNOSIS — J329 Chronic sinusitis, unspecified: Secondary | ICD-10-CM | POA: Insufficient documentation

## 2012-04-13 HISTORY — DX: Chronic sinusitis, unspecified: J32.9

## 2012-04-13 HISTORY — DX: Encounter for general adult medical examination without abnormal findings: Z00.00

## 2012-04-13 NOTE — Assessment & Plan Note (Signed)
Fasting labs performed, encouraged 8 ours sleep, regular exercise, heart healthy diet

## 2012-04-13 NOTE — Assessment & Plan Note (Signed)
Encouraged Mucinex bid and probiotics, increased rest and hydration and given a Zpak to use if symptoms worsen

## 2012-04-13 NOTE — Assessment & Plan Note (Signed)
Improved with dietary changes,minimizing gluten etc.

## 2012-04-13 NOTE — Progress Notes (Signed)
Patient ID: Katherine Beasley, female   DOB: 01/16/1961, 53 y.o.   MRN: [id number] Katherine Beasley [id number] 1960/05/11 04/13/2012      Progress Note New Patient  Subjective  Chief Complaint  Chief Complaint  Patient presents with  . Establish Care    physical w/ labs    HPI  Patient is a 52 year old Caucasian female who is in today for annual exam. She generally been doing well and she's just returned from a cruise and is struggling with some respiratory symptoms at the present time. She's had several days now for mild sore throat and some right ear pain and pressure. She has some facial congestion and pressure. Nasal congestion without significant rhinorrhea. A. there is mild clear rhinorrhea. Has a cough which mostly feels like it comes from postnasal drip and throat irritation. No throat pain no chest pain or palpitations no shortness of breath GI or GU complaints. She's made some dietary changes including minimizing glutin and says her abdominal pain is essentially resolved.  Past Medical History  Diagnosis Date  . MVP (mitral valve prolapse) 30 years ago  . Abdominal pain 12/29/2010  . Heart murmur   . Hyperlipidemia     no meds, diet only  . History of recurrent UTIs 12/29/2010  . Skin lesion of chest wall 12/29/2010  . Thrombocytosis 12/30/2010  . Cervical cancer screening 04/04/2011  . Preventative health care 04/13/2012    Past Surgical History  Procedure Laterality Date  . Cesarean section      x 2  . Tonsillectomy  1966    Family History  Problem Relation Age of Onset  . Hyperlipidemia Mother   . Thyroid disease Mother     partial thyroidectomy many years ago for goiter now hypothyroid  . Colon polyps Mother   . Cancer Father 34    prostate  . Hyperlipidemia Father   . Hypertension Father   . Prostate cancer Father   . Diabetes Paternal Aunt   . Diabetes Paternal Uncle   . Cancer Maternal Grandmother 85    breast  . Breast cancer Maternal Grandmother   .  Stroke Paternal Grandfather   . Diabetes Paternal Grandfather     History   Social History  . Marital Status: Married    Spouse Name: Genevie Cheshire    Number of Children: 2  . Years of Education: N/A   Occupational History  . Not on file.   Social History Main Topics  . Smoking status: Never Smoker   . Smokeless tobacco: Never Used  . Alcohol Use: No  . Drug Use: No  . Sexually Active: Yes   Other Topics Concern  . Not on file   Social History Narrative  . No narrative on file    Current Outpatient Prescriptions on File Prior to Visit  Medication Sig Dispense Refill  . calcium carbonate (OS-CAL) 600 MG TABS Take 2 tablets by mouth daily.        . Omega-3 Fatty Acids (FISH OIL) 1200 MG CAPS Take 1 capsule by mouth 3 (three) times daily after meals.         No current facility-administered medications on file prior to visit.    No Known Allergies  Review of Systems  Review of Systems  Constitutional: Positive for malaise/fatigue. Negative for fever.  HENT: Positive for ear pain and congestion.   Eyes: Negative for discharge.  Respiratory: Positive for cough and sputum production. Negative for shortness of breath.   Cardiovascular: Negative for  chest pain, palpitations and leg swelling.  Gastrointestinal: Negative for nausea, abdominal pain and diarrhea.  Genitourinary: Negative for dysuria.  Musculoskeletal: Negative for falls.  Skin: Negative for rash.  Neurological: Positive for headaches. Negative for loss of consciousness.  Endo/Heme/Allergies: Negative for polydipsia.  Psychiatric/Behavioral: Negative for depression and suicidal ideas. The patient is not nervous/anxious and does not have insomnia.     Objective  BP 144/82  Pulse 81  Temp(Src) 98.4 F (36.9 C) (Temporal)  Ht 5\' 7"  (1.702 m)  Wt 156 lb (70.761 kg)  BMI 24.43 kg/m2  SpO2 99%  Physical Exam  Physical Exam  Constitutional: She is oriented to person, place, and time and well-developed,  well-nourished, and in no distress. No distress.  HENT:  Head: Normocephalic and atraumatic.  Right Ear: External ear normal.  Left Ear: External ear normal.  Nose: Nose normal.  Mouth/Throat: Oropharynx is clear and moist. No oropharyngeal exudate.  Eyes: Conjunctivae are normal. Pupils are equal, round, and reactive to light. Right eye exhibits no discharge. Left eye exhibits no discharge. No scleral icterus.  Neck: Normal range of motion. Neck supple. No thyromegaly present.  Cardiovascular: Normal rate, regular rhythm, normal heart sounds and intact distal pulses.   No murmur heard. Pulmonary/Chest: Effort normal and breath sounds normal. No respiratory distress. She has no wheezes. She has no rales.  Abdominal: Soft. Bowel sounds are normal. She exhibits no distension and no mass. There is no tenderness.  Musculoskeletal: Normal range of motion. She exhibits no edema and no tenderness.  Lymphadenopathy:    She has no cervical adenopathy.  Neurological: She is alert and oriented to person, place, and time. She has normal reflexes. No cranial nerve deficit. Coordination normal.  Skin: Skin is warm and dry. No rash noted. She is not diaphoretic.  Psychiatric: Mood, memory and affect normal.       Assessment & Plan  Preventative health care Fasting labs performed, encouraged 8 ours sleep, regular exercise, heart healthy diet  Hyperlipidemia Encouraged krill oil caps, avoid trans fats, maintain exercise, monitor  Abdominal pain Improved with dietary changes,minimizing gluten etc.   Unspecified sinusitis (chronic) Encouraged Mucinex bid and probiotics, increased rest and hydration and given a Zpak to use if symptoms worsen

## 2012-04-13 NOTE — Assessment & Plan Note (Signed)
Encouraged krill oil caps, avoid trans fats, maintain exercise, monitor

## 2012-04-19 ENCOUNTER — Other Ambulatory Visit: Payer: Self-pay

## 2012-05-23 IMAGING — US US ABDOMEN COMPLETE
1 series · 13 of 25 positions shown · non-contrast
Comparison: None

CLINICAL DATA: History of upper abdominal pain.

ABDOMINAL ULTRASOUND COMPLETE

[Series 1: us abdomen complete · 0.24mm/px · 13 of 91 slices shown]
[im 1/91]
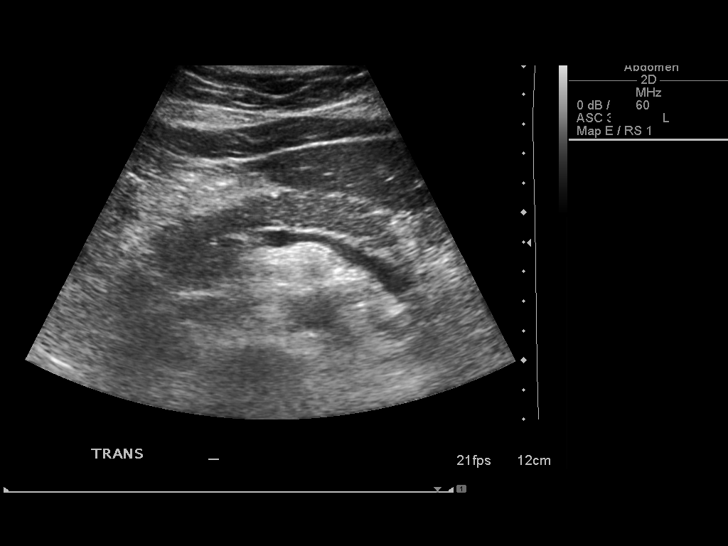
[im 8/91]
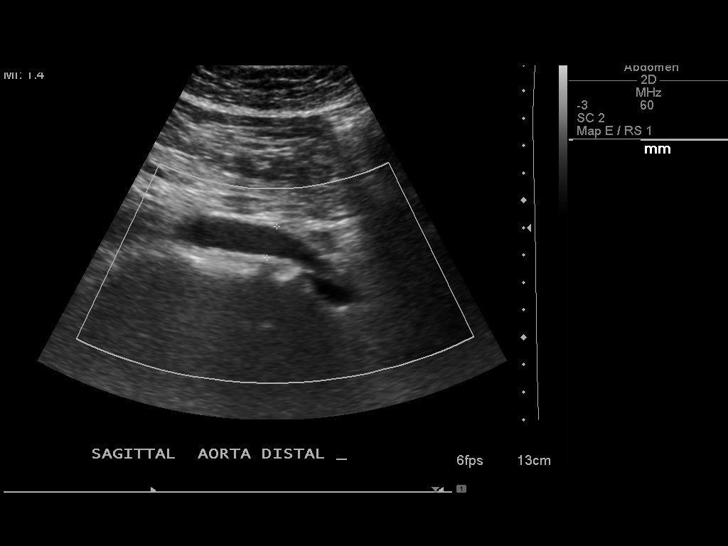
[im 16/91]
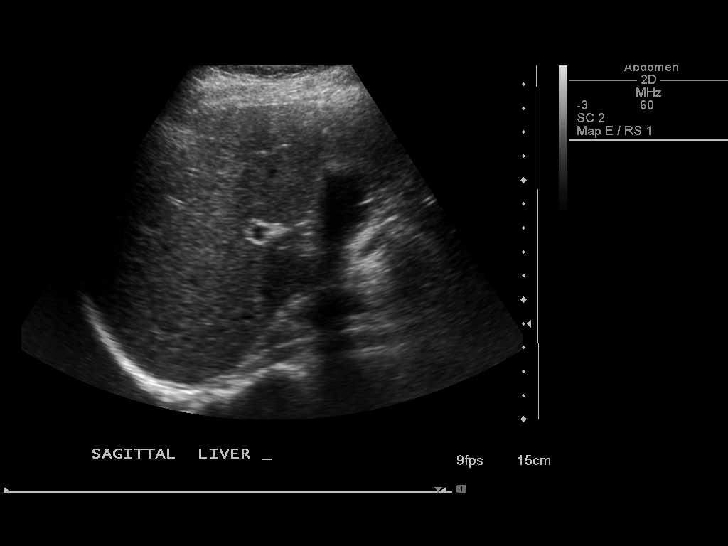
[im 23/91]
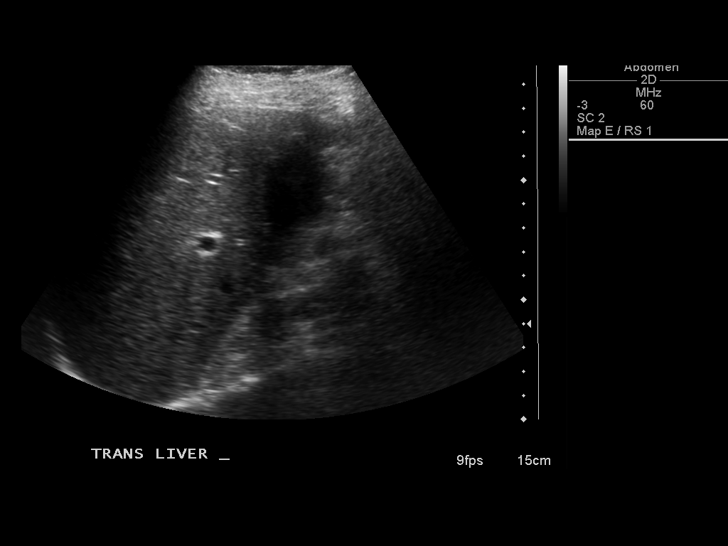
[im 31/91]
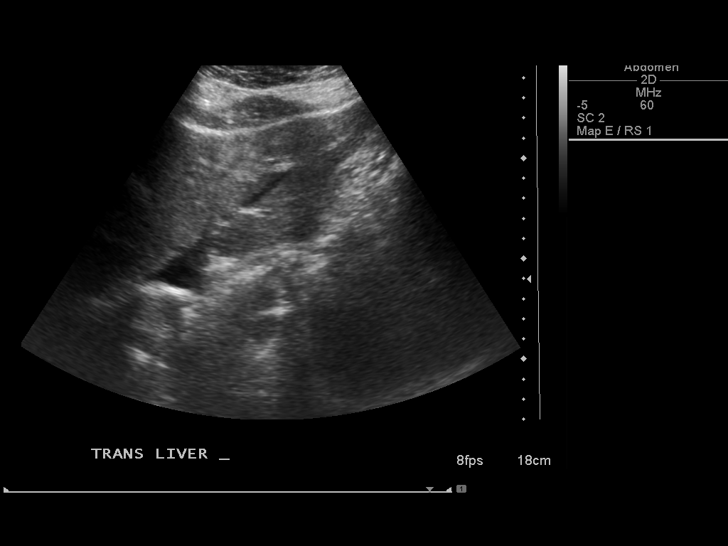
[im 38/91]
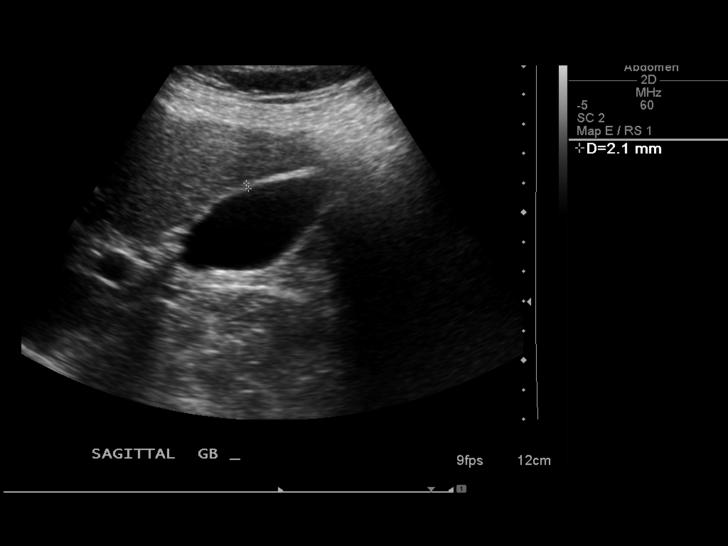
[im 46/91]
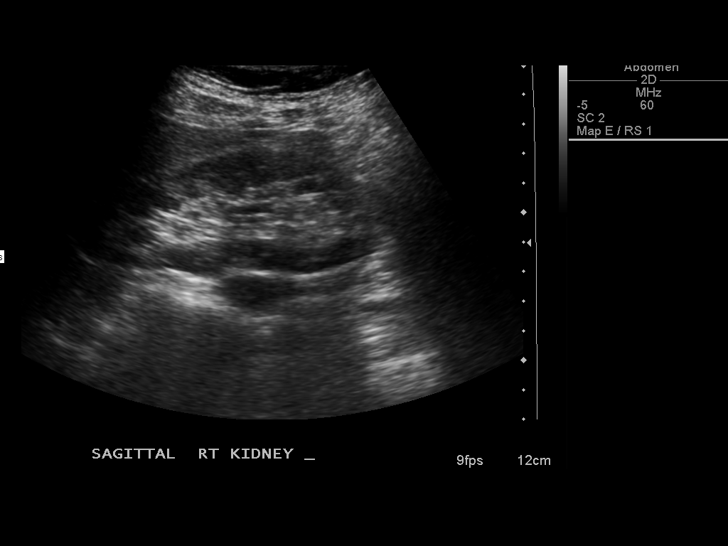
[im 53/91]
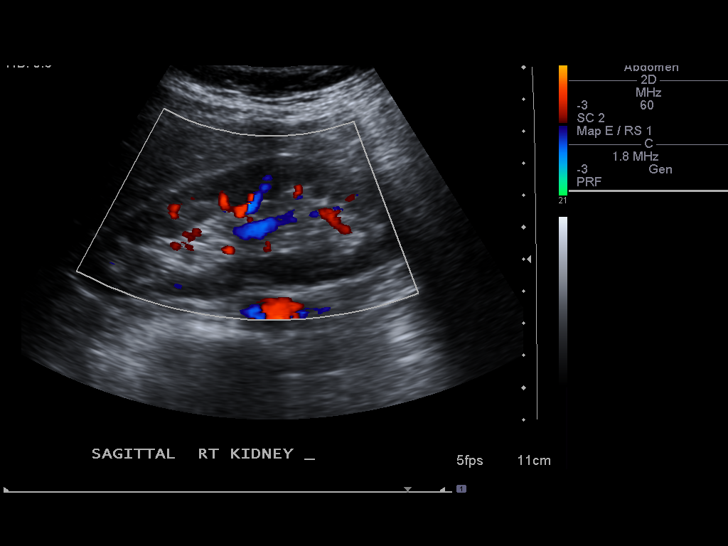
[im 61/91]
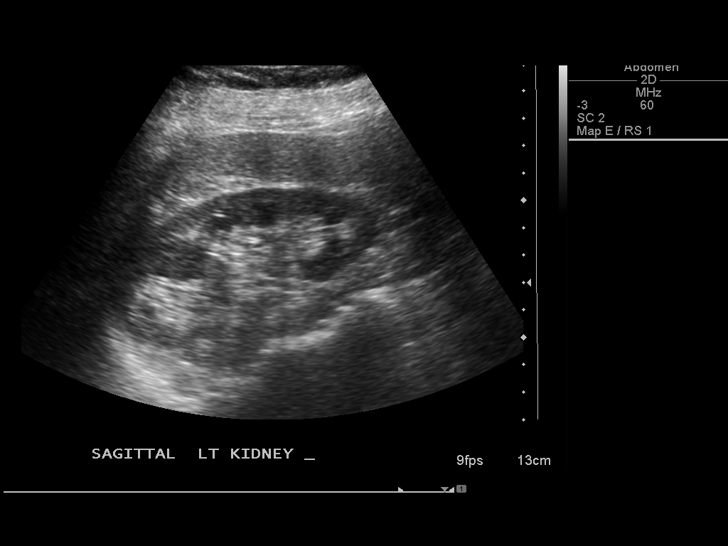
[im 68/91]
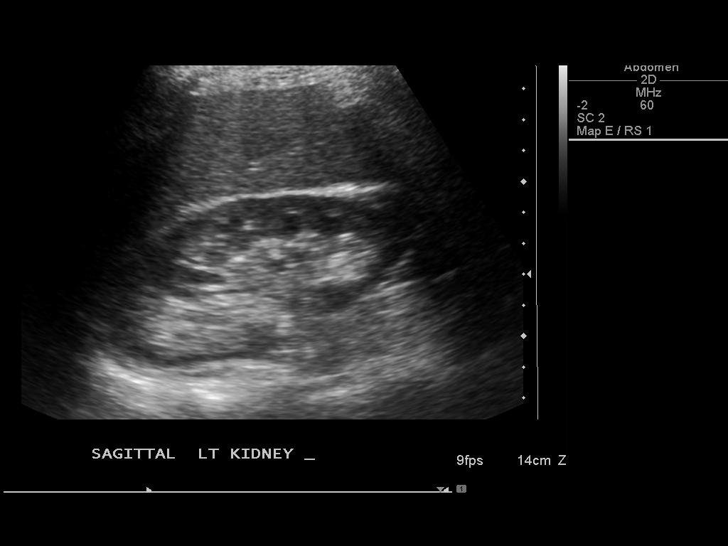
[im 76/91]
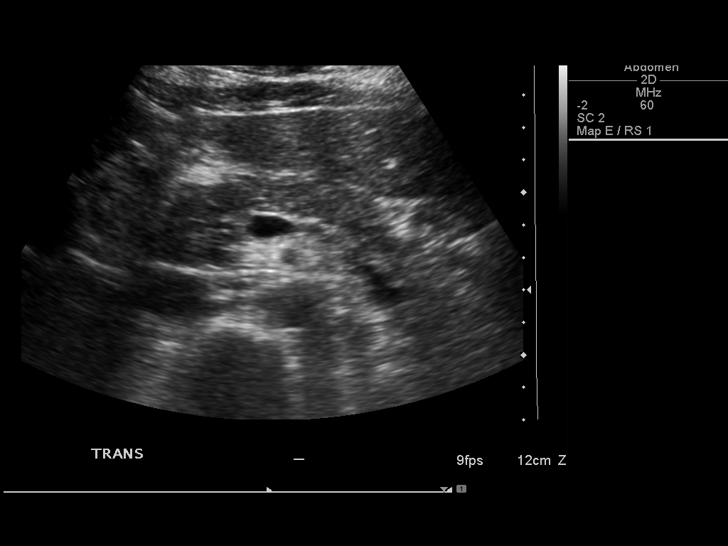
[im 83/91]
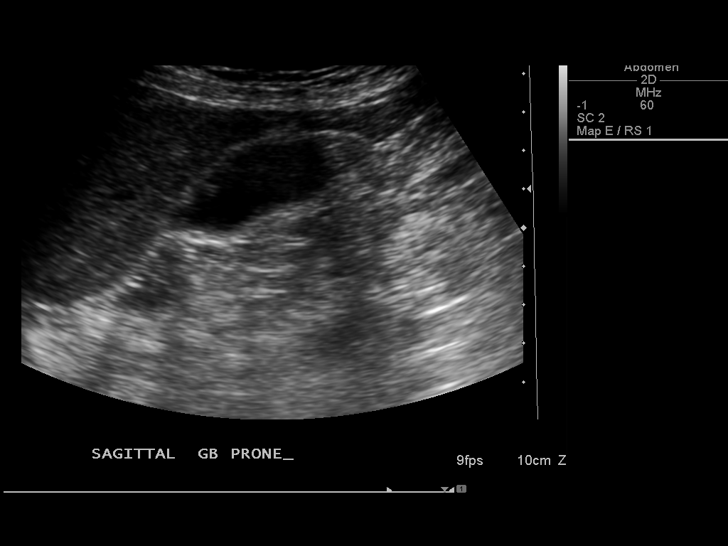
[im 91/91]
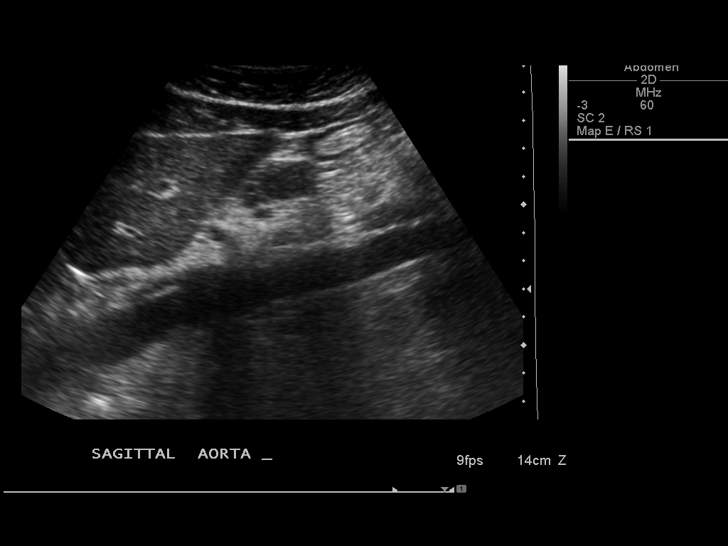

[13 of 25 positions shown; findings below may reference images not displayed]

FINDINGS: Gallbladder: There is a 4.6 mm gallbladder polyp.  No shadowing
gallstones or echogenic sludge. No gallbladder wall thickening or
pericholecystic fluid. The gallbladder wall thickness measured
mm. No sonographic Murphy's sign according to the ultrasound
technologist.

CBD: Normal in caliber measuring 3.3 mm. No choledocholithiasis is
evident.

Liver:  Normal size and echotexture without focal parenchymal
abnormality.

IVC:  Patent throughout its visualized course in the abdomen.

Pancreas:  Although the pancreas is difficult to visualize in its
entirety, no focal pancreatic abnormality is identified.

Spleen:  Normal size and echotexture without focal abnormality.
Length is 10.2 cm.

Right kidney:  No hydronephrosis.  Well-preserved cortex.  Normal
parenchymal echotexture without focal abnormalities.  Right renal
length is 11.6 cm.

Left kidney:  No hydronephrosis.  Well-preserved cortex.  Normal
parenchymal echotexture without focal abnormalities.  Left renal
length is  11.5 cm.

Aorta:  Maximum diameter is 1.9 cm.  No aneurysm is evident.

Ascites:  None.
IMPRESSION: No acute abdominal pathology was demonstrated.  There is a small
gallbladder polyp. No other abdominal pathology was identified.

## 2013-01-08 ENCOUNTER — Other Ambulatory Visit: Payer: Self-pay

## 2013-04-15 ENCOUNTER — Encounter: Payer: 59 | Admitting: Family Medicine

## 2013-04-21 ENCOUNTER — Encounter: Payer: 59 | Admitting: Family Medicine

## 2013-06-29 ENCOUNTER — Encounter: Payer: Self-pay | Admitting: Family Medicine

## 2013-06-29 ENCOUNTER — Ambulatory Visit (INDEPENDENT_AMBULATORY_CARE_PROVIDER_SITE_OTHER): Payer: 59 | Admitting: Family Medicine

## 2013-06-29 VITALS — BP 138/84 | HR 74 | Temp 98.4°F | Ht 67.0 in | Wt 160.1 lb

## 2013-06-29 DIAGNOSIS — R739 Hyperglycemia, unspecified: Secondary | ICD-10-CM

## 2013-06-29 DIAGNOSIS — E785 Hyperlipidemia, unspecified: Secondary | ICD-10-CM

## 2013-06-29 DIAGNOSIS — Z87440 Personal history of urinary (tract) infections: Secondary | ICD-10-CM

## 2013-06-29 DIAGNOSIS — Z0000 Encounter for general adult medical examination without abnormal findings: Secondary | ICD-10-CM

## 2013-06-29 DIAGNOSIS — R7309 Other abnormal glucose: Secondary | ICD-10-CM

## 2013-06-29 NOTE — Progress Notes (Signed)
Pre visit review using our clinic review tool, if applicable. No additional management support is needed unless otherwise documented below in the visit note. 

## 2013-06-29 NOTE — Patient Instructions (Signed)
Consider krill oil to replace the fish, MegaRed   Cholesterol Cholesterol is a white, waxy, fat-like protein needed by your body in small amounts. The liver makes all the cholesterol you need. It is carried from the liver by the blood through the blood vessels. Deposits (plaque) may build up on blood vessel walls. This makes the arteries narrower and stiffer. Plaque increases the risk for heart attack and stroke. You cannot feel your cholesterol level even if it is very high. The only way to know is by a blood test to check your lipid (fats) levels. Once you know your cholesterol levels, you should keep a record of the test results. Work with your caregiver to to keep your levels in the desired range. WHAT THE RESULTS MEAN:  Total cholesterol is a rough measure of all the cholesterol in your blood.  LDL is the so-called bad cholesterol. This is the type that deposits cholesterol in the walls of the arteries. You want this level to be low.  HDL is the good cholesterol because it cleans the arteries and carries the LDL away. You want this level to be high.  Triglycerides are fat that the body can either burn for energy or store. High levels are closely linked to heart disease. DESIRED LEVELS:  Total cholesterol below 200.  LDL below 100 for people at risk, below 70 for very high risk.  HDL above 50 is good, above 60 is best.  Triglycerides below 150. HOW TO LOWER YOUR CHOLESTEROL:  Diet.  Choose fish or white meat chicken and [place], roasted or baked. Limit fatty cuts of red meat, fried foods, and processed meats, such as sausage and lunch meat.  Eat lots of fresh fruits and vegetables. Choose whole grains, beans, pasta, potatoes and cereals.  Use only small amounts of olive, corn or canola oils. Avoid butter, mayonnaise, shortening or palm kernel oils. Avoid foods with trans-fats.  Use skim/nonfat milk and low-fat/nonfat yogurt and cheeses. Avoid whole milk, cream, ice cream, egg  yolks and cheeses. Healthy desserts include angel food cake, ginger snaps, animal crackers, hard candy, popsicles, and low-fat/nonfat frozen yogurt. Avoid pastries, cakes, pies and cookies.  Exercise.  A regular program helps decrease LDL and raises HDL.  Helps with weight control.  Do things that increase your activity level like gardening, walking, or taking the stairs.  Medication.  May be prescribed by your caregiver to help lowering cholesterol and the risk for heart disease.  You may need medicine even if your levels are normal if you have several risk factors. HOME CARE INSTRUCTIONS   Follow your diet and exercise programs as suggested by your caregiver.  Take medications as directed.  Have blood work done when your caregiver feels it is necessary. MAKE SURE YOU:   Understand these instructions.  Will watch your condition.  Will get help right away if you are not doing well or get worse. Document Released: 11/14/2000 Document Revised: 05/14/2011 Document Reviewed: 12/03/2012 [facility] Patient Information 2014 [place], [place].

## 2013-06-30 LAB — CBC
HEMATOCRIT: 40.2 % (ref 36.0–46.0)
HEMOGLOBIN: 13.8 g/dL (ref 12.0–15.0)
MCH: 30.6 pg (ref 26.0–34.0)
MCHC: 34.3 g/dL (ref 30.0–36.0)
MCV: 89.1 fL (ref 78.0–100.0)
Platelets: 372 10*3/uL (ref 150–400)
RBC: 4.51 MIL/uL (ref 3.87–5.11)
RDW: 13 % (ref 11.5–15.5)
WBC: 8.1 10*3/uL (ref 4.0–10.5)

## 2013-06-30 LAB — LIPID PANEL
CHOLESTEROL: 240 mg/dL — AB (ref 0–200)
HDL: 44 mg/dL (ref >39)
LDL Cholesterol: 149 mg/dL — ABNORMAL HIGH (ref 0–99)
TRIGLYCERIDES: 237 mg/dL — AB (ref <150)
Total CHOL/HDL Ratio: 5.5 Ratio
VLDL: 47 mg/dL — ABNORMAL HIGH (ref 0–40)

## 2013-06-30 LAB — HEPATIC FUNCTION PANEL
ALBUMIN: 4.4 g/dL (ref 3.5–5.2)
ALK PHOS: 64 U/L (ref 39–117)
ALT: 13 U/L (ref 0–35)
AST: 12 U/L (ref 0–37)
BILIRUBIN INDIRECT: 0.5 mg/dL (ref 0.2–1.2)
BILIRUBIN TOTAL: 0.6 mg/dL (ref 0.2–1.2)
Bilirubin, Direct: 0.1 mg/dL (ref 0.0–0.3)
TOTAL PROTEIN: 6.7 g/dL (ref 6.0–8.3)

## 2013-06-30 LAB — HEMOGLOBIN A1C
Hgb A1c MFr Bld: 5.9 % — ABNORMAL HIGH (ref <5.7)
Mean Plasma Glucose: 123 mg/dL — ABNORMAL HIGH (ref <117)

## 2013-06-30 LAB — RENAL FUNCTION PANEL
Albumin: 4.4 g/dL (ref 3.5–5.2)
BUN: 11 mg/dL (ref 6–23)
CHLORIDE: 105 meq/L (ref 96–112)
CO2: 27 mEq/L (ref 19–32)
Calcium: 9.3 mg/dL (ref 8.4–10.5)
Creat: 0.58 mg/dL (ref 0.50–1.10)
GLUCOSE: 109 mg/dL — AB (ref 70–99)
POTASSIUM: 4.1 meq/L (ref 3.5–5.3)
Phosphorus: 3 mg/dL (ref 2.3–4.6)
Sodium: 142 mEq/L (ref 135–145)

## 2013-06-30 LAB — TSH: TSH: 1.447 u[IU]/mL (ref 0.350–4.500)

## 2013-07-05 DIAGNOSIS — R739 Hyperglycemia, unspecified: Secondary | ICD-10-CM | POA: Insufficient documentation

## 2013-07-05 NOTE — Progress Notes (Signed)
Patient ID: Katherine Beasley, female   DOB: [birth year], 53 y.o.   MRN: [id number] AARON BOEH [id number] 1961-01-20 07/05/2013      Progress Note-Follow Up  Subjective  Chief Complaint  Chief Complaint  Patient presents with  . Annual Exam    HPI  Patient is a 53 year old female in today for routine medical care. No acute concerns. No fevers, headache or gyn c/o. Denies CP/palp/SOB/HA/congestion/fevers/GI or GU c/o. Taking meds as prescribed  Past Medical History  Diagnosis Date  . MVP (mitral valve prolapse) 30 years ago  . Abdominal pain 12/29/2010  . Heart murmur   . Hyperlipidemia     no meds, diet only  . History of recurrent UTIs 12/29/2010  . Skin lesion of chest wall 12/29/2010  . Thrombocytosis 12/30/2010  . Cervical cancer screening 04/04/2011  . Preventative health care 04/13/2012  . Unspecified sinusitis (chronic) 04/13/2012    Past Surgical History  Procedure Laterality Date  . Cesarean section      x 2  . Tonsillectomy  1966    Family History  Problem Relation Age of Onset  . Hyperlipidemia Mother   . Thyroid disease Mother     partial thyroidectomy many years ago for goiter now hypothyroid  . Colon polyps Mother   . Cancer Father 38    prostate  . Hyperlipidemia Father   . Hypertension Father   . Prostate cancer Father   . Diabetes Paternal Aunt   . Diabetes Paternal Uncle   . Cancer Maternal Grandmother 19    breast  . Breast cancer Maternal Grandmother   . Stroke Paternal Grandfather   . Diabetes Paternal Grandfather     History   Social History  . Marital Status: Married    Spouse Name: Abe People    Number of Children: 2  . Years of Education: N/A   Occupational History  . Not on file.   Social History Main Topics  . Smoking status: Never Smoker   . Smokeless tobacco: Never Used  . Alcohol Use: No  . Drug Use: No  . Sexual Activity: Yes   Other Topics Concern  . Not on file   Social History Narrative  . No narrative on file     Current Outpatient Prescriptions on File Prior to Visit  Medication Sig Dispense Refill  . calcium carbonate (OS-CAL) 600 MG TABS Take 2 tablets by mouth daily.        . Omega-3 Fatty Acids (FISH OIL) 1200 MG CAPS Take 1 capsule by mouth 3 (three) times daily after meals.         No current facility-administered medications on file prior to visit.    No Known Allergies  Review of Systems  Review of Systems  Constitutional: Negative for fever, chills and malaise/fatigue.  HENT: Negative for congestion, hearing loss and nosebleeds.   Eyes: Negative for discharge.  Respiratory: Negative for cough, sputum production, shortness of breath and wheezing.   Cardiovascular: Negative for chest pain, palpitations and leg swelling.  Gastrointestinal: Negative for heartburn, nausea, vomiting, abdominal pain, diarrhea, constipation and blood in stool.  Genitourinary: Negative for dysuria, urgency, frequency and hematuria.  Musculoskeletal: Negative for back pain, falls and myalgias.  Skin: Negative for rash.  Neurological: Negative for dizziness, tremors, sensory change, focal weakness, loss of consciousness, weakness and headaches.  Endo/Heme/Allergies: Negative for polydipsia. Does not bruise/bleed easily.  Psychiatric/Behavioral: Negative for depression and suicidal ideas. The patient is not nervous/anxious and does not have insomnia.  Objective  BP 138/84  Pulse 74  Temp(Src) 98.4 F (36.9 C) (Oral)  Ht 5\' 7"  (1.702 m)  Wt 160 lb 1.3 oz (72.612 kg)  BMI 25.07 kg/m2  SpO2 98%  Physical Exam  Physical Exam  Constitutional: She is oriented to person, place, and time and well-developed, well-nourished, and in no distress. No distress.  HENT:  Head: Normocephalic and atraumatic.  Right Ear: External ear normal.  Left Ear: External ear normal.  Nose: Nose normal.  Mouth/Throat: Oropharynx is clear and moist. No oropharyngeal exudate.  Eyes: Conjunctivae are normal. Pupils are  equal, round, and reactive to light. Right eye exhibits no discharge. Left eye exhibits no discharge. No scleral icterus.  Neck: Normal range of motion. Neck supple. No thyromegaly present.  Cardiovascular: Normal rate, regular rhythm, normal heart sounds and intact distal pulses.   No murmur heard. Pulmonary/Chest: Effort normal and breath sounds normal. No respiratory distress. She has no wheezes. She has no rales.  Abdominal: Soft. Bowel sounds are normal. She exhibits no distension and no mass. There is no tenderness.  Musculoskeletal: Normal range of motion. She exhibits no edema and no tenderness.  Lymphadenopathy:    She has no cervical adenopathy.  Neurological: She is alert and oriented to person, place, and time. She has normal reflexes. No cranial nerve deficit. Coordination normal.  Skin: Skin is warm and dry. No rash noted. She is not diaphoretic.  Psychiatric: Mood, memory and affect normal.    Lab Results  Component Value Date   TSH 1.447 06/29/2013   Lab Results  Component Value Date   WBC 8.1 06/29/2013   HGB 13.8 06/29/2013   HCT 40.2 06/29/2013   MCV 89.1 06/29/2013   PLT 372 06/29/2013   Lab Results  Component Value Date   CREATININE 0.58 06/29/2013   BUN 11 06/29/2013   NA 142 06/29/2013   K 4.1 06/29/2013   CL 105 06/29/2013   CO2 27 06/29/2013   Lab Results  Component Value Date   ALT 13 06/29/2013   AST 12 06/29/2013   ALKPHOS 64 06/29/2013   BILITOT 0.6 06/29/2013   Lab Results  Component Value Date   CHOL 240* 06/29/2013   Lab Results  Component Value Date   HDL 44 06/29/2013   Lab Results  Component Value Date   LDLCALC 149* 06/29/2013   Lab Results  Component Value Date   TRIG 237* 06/29/2013   Lab Results  Component Value Date   CHOLHDL 5.5 06/29/2013     Assessment & Plan  Preventative health care Patient encouraged to maintain heart healthy diet, regular exercise, adequate sleep. Consider daily probiotics. Take medications as prescribed.  [place] care with gynecology  History of recurrent UTIs No recent episodes  Hyperglycemia hgba1c acceptable, minimize simple carbs. Increase exercise as tolerated.

## 2013-07-05 NOTE — Assessment & Plan Note (Signed)
No recent episodes

## 2013-07-05 NOTE — Assessment & Plan Note (Addendum)
Patient encouraged to maintain heart healthy diet, regular exercise, adequate sleep. Consider daily probiotics. Take medications as prescribed. [place] care with gynecology

## 2013-07-05 NOTE — Assessment & Plan Note (Signed)
hgba1c acceptable, minimize simple carbs. Increase exercise as tolerated.  

## 2014-06-11 ENCOUNTER — Telehealth: Payer: Self-pay | Admitting: Family Medicine

## 2014-06-11 NOTE — Telephone Encounter (Signed)
Pre visit letter sent  °

## 2014-07-02 ENCOUNTER — Encounter: Payer: 59 | Admitting: Family Medicine
